# Patient Record
Sex: Female | Born: 1945 | Race: White | Hispanic: No | Marital: Married | Smoking: Never smoker
Health system: Southern US, Community
[De-identification: ages and names within clinical notes are randomized; demographics above are authoritative.]

## PROBLEM LIST (undated history)

## (undated) HISTORY — PX: APPENDECTOMY: SHX54

## (undated) HISTORY — PX: CERVICAL DISC SURGERY: SHX588

---

## 2010-11-15 ENCOUNTER — Emergency Department: Payer: Self-pay | Admitting: Emergency Medicine

## 2010-11-15 ENCOUNTER — Ambulatory Visit: Payer: Self-pay | Admitting: Family Medicine

## 2012-10-07 IMAGING — CR DG CHEST 1V PORT
1 series · 2 of 2 positions shown · non-contrast
Comparison: none

REASON FOR EXAM: sob
COMMENTS:

PROCEDURE:     DXR - DXR PORTABLE CHEST SINGLE VIEW  - November 15, 2010  [DATE]
RESULT:     Mild atelectasis versus infiltrate noted in the right lower
lobe. The left lung is clear. The cardiovascular structures are unremarkable.

[Series 1: view not recorded · 0.17mm/px · 2 of 2 slices shown]
[im 1/2]
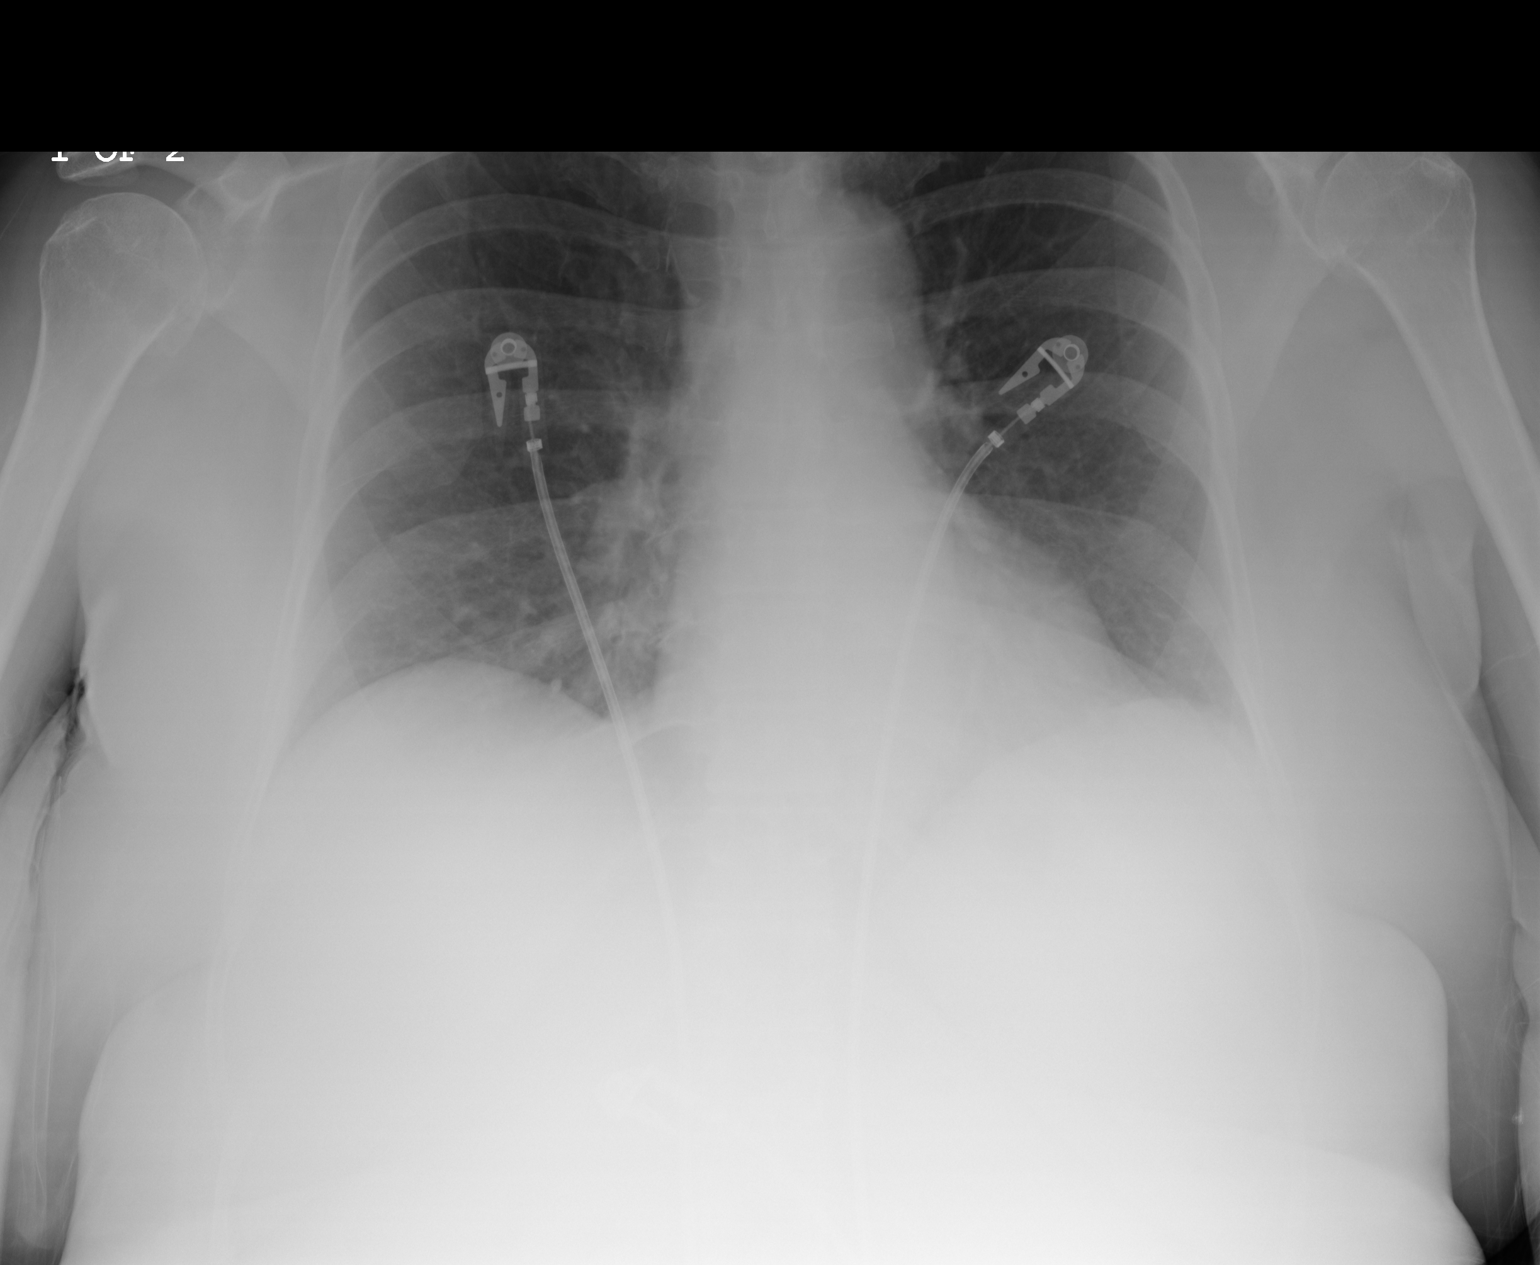
[im 2/2]
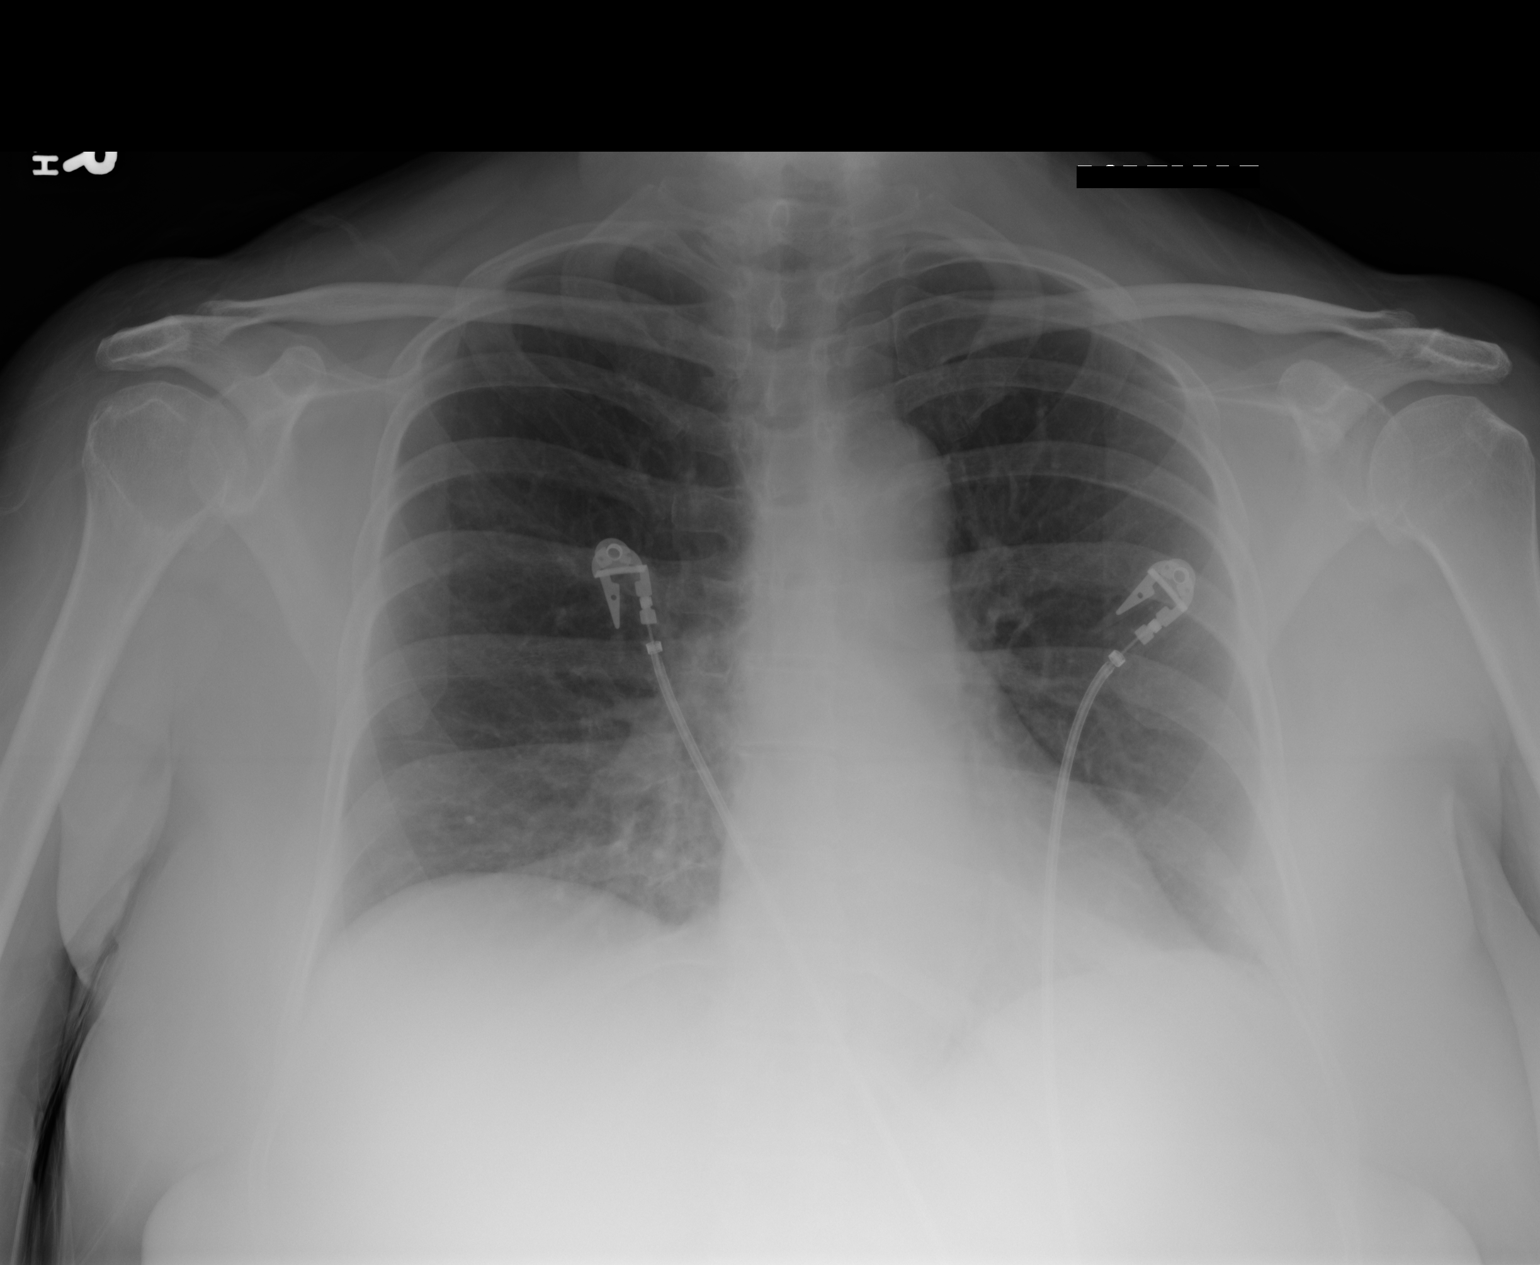

[2 of 2 positions shown; findings below may reference images not displayed]

IMPRESSION: Mild atelectasis versus infiltrate, right lower lobe.

## 2016-04-24 ENCOUNTER — Inpatient Hospital Stay: Payer: Medicare Other | Attending: Hematology and Oncology | Admitting: Hematology and Oncology

## 2016-04-24 ENCOUNTER — Encounter: Payer: Self-pay | Admitting: Hematology and Oncology

## 2016-04-24 ENCOUNTER — Inpatient Hospital Stay: Payer: Medicare Other

## 2016-04-24 DIAGNOSIS — Z79899 Other long term (current) drug therapy: Secondary | ICD-10-CM

## 2016-04-24 DIAGNOSIS — R5383 Other fatigue: Secondary | ICD-10-CM

## 2016-04-24 DIAGNOSIS — R238 Other skin changes: Secondary | ICD-10-CM | POA: Diagnosis not present

## 2016-04-24 DIAGNOSIS — D751 Secondary polycythemia: Secondary | ICD-10-CM | POA: Insufficient documentation

## 2016-04-24 DIAGNOSIS — R233 Spontaneous ecchymoses: Secondary | ICD-10-CM | POA: Insufficient documentation

## 2016-04-24 LAB — CBC WITH DIFFERENTIAL/PLATELET
Basophils Absolute: 0.1 10*3/uL (ref 0–0.1)
Basophils Relative: 1 %
Eosinophils Absolute: 0.1 10*3/uL (ref 0–0.7)
Eosinophils Relative: 1 %
HCT: 44.8 % (ref 35.0–47.0)
Hemoglobin: 15.2 g/dL (ref 12.0–16.0)
Lymphocytes Relative: 24 %
Lymphs Abs: 2.6 10*3/uL (ref 1.0–3.6)
MCH: 31.3 pg (ref 26.0–34.0)
MCHC: 33.9 g/dL (ref 32.0–36.0)
MCV: 92.2 fL (ref 80.0–100.0)
Monocytes Absolute: 0.7 10*3/uL (ref 0.2–0.9)
Monocytes Relative: 7 %
Neutro Abs: 7.3 10*3/uL — ABNORMAL HIGH (ref 1.4–6.5)
Neutrophils Relative %: 67 %
Platelets: 383 10*3/uL (ref 150–440)
RBC: 4.86 MIL/uL (ref 3.80–5.20)
RDW: 13.5 % (ref 11.5–14.5)
WBC: 10.8 10*3/uL (ref 3.6–11.0)

## 2016-04-24 LAB — PLATELET FUNCTION ASSAY: Collagen / Epinephrine: 124 seconds (ref 0–193)

## 2016-04-24 LAB — TSH: TSH: 1.652 u[IU]/mL (ref 0.350–4.500)

## 2016-04-24 LAB — T4, FREE: Free T4: 1.1 ng/dL (ref 0.61–1.12)

## 2016-04-24 NOTE — Progress Notes (Signed)
[facility]-  Cancer Center  Clinic day:  04/24/2016  Chief Complaint: Colleen Calhoun is a 70 y.o. female with easy bruising who is referred in consultation by Dr. Wendie Simmer for assessment and mangement.  HPI: The patient notes a several month history of easy bruisability. She states that she can barely be touched and be bruised. She has had prior surgery (appendectomy) without bleeding. She denies any other bleeding. She denies any aspirin or ibuprofen use. She denies any kidney disease. She previously had normal menses.  CBC on 04/09/2016 revealed a hematocrit of 46.4, hemoglobin 15.1, MCV 95, platelets 414,000, WBC 11,700.  PT 11.9 with an INR of 1.0.  PTT was 28.5.  Creatinine was 0.7 on 03/19/2016.  Symptomatically, she gets tired around 3 PM.  She denies any aspirin or ibuprofen use.  She denies any fevers or sweats.  She takes oral B12.  She is up-to-date on her mammogram (2017) and colonoscopy (2016).  She has dense breasts. She is unsure if she has sleep apnea.  She thinks that she snores. She does not smoke.   No past medical history on file.  Past Surgical History:  Procedure Laterality Date  . APPENDECTOMY    . CERVICAL DISC SURGERY      No family history on file.  Social History:  has no tobacco, alcohol, and drug history on file.  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  She lives in [place].  The patient is alone today.  Allergies:  Allergies  Allergen Reactions  . Codeine Nausea Only  . Morphine Rash    Current Medications: Current Outpatient Prescriptions  Medication Sig Dispense Refill  . Vitamin D, Ergocalciferol, (DRISDOL) 50000 units CAPS capsule Take one pill every 2 weeks    . ALPRAZolam (XANAX) 0.25 MG tablet TK 1 T PO NIGHTLY PRF SLP  5  . citalopram (CELEXA) 20 MG tablet TK 1 T PO ONCE D  3  . hydrochlorothiazide (HYDRODIURIL) 25 MG tablet   1  . pyridoxine (B-6) 100 MG tablet Take by mouth.     No current  facility-administered medications for this visit.     Review of Systems:  GENERAL:  Gets tired around 3 pm.  No fevers, sweats or weight loss. PERFORMANCE STATUS (ECOG):  1 HEENT:  No visual changes, runny nose, sore throat, mouth sores or tenderness. Lungs: No shortness of breath or cough.  No hemoptysis.  Snores (? sleep apnea). Cardiac:  No chest pain, palpitations, orthopnea, or PND. GI:  No nausea, vomiting, diarrhea, constipation, melena or hematochezia. Colonoscopy 2016. GU:  No urgency, frequency, dysuria, or hematuria. Musculoskeletal:  No back pain.  No joint pain.  No muscle tenderness. Extremities:  No pain or swelling. Skin:  Easy bruising.  No bleeding.  No rashes or skin changes. Neuro:  No headache, numbness or weakness, balance or coordination issues. Endocrine:  No diabetes, thyroid issues, hot flashes or night sweats. Psych:  No mood changes, depression or anxiety. Pain:  No focal pain. Review of systems:  All other systems reviewed and found to be negative.  Physical Exam: Blood pressure 124/76, pulse 65, temperature (!) 96.6 F (35.9 C), resp. rate 18, height 5\' 1"  (1.549 m), weight 143 lb 15.4 oz (65.3 kg). GENERAL:  Well developed, well nourished, woman sitting comfortably in the exam room in no acute distress. MENTAL STATUS:  Alert and oriented to person, place and time. HEAD:  Short styled blonde hair.  Normocephalic, atraumatic, face  symmetric, no Cushingoid features. EYES:  Pupils equal round and reactive to light and accomodation.  No conjunctivitis or scleral icterus. ENT:  Oropharynx clear without lesion.  Tongue normal. Mucous membranes moist.  RESPIRATORY:  Clear to auscultation without rales, wheezes or rhonchi. CARDIOVASCULAR:  Regular rate and rhythm without murmur, rub or gallop. ABDOMEN:  Soft, non-tender, with active bowel sounds, and no hepatosplenomegaly.  No masses. SKIN:  No rashes, ulcers or lesions. EXTREMITIES: No edema, no skin  discoloration or tenderness.  No palpable cords. LYMPH NODES: No palpable cervical, supraclavicular, axillary or inguinal adenopathy  NEUROLOGICAL: Unremarkable. PSYCH:  Appropriate.   No visits with results within 3 Day(s) from this visit.  Latest known visit with results is:  No results found for any previous visit.    Assessment:  Colleen Calhoun is a 70 y.o. female with a 6 month history of easy bruising.  She denies any bleeding.  She denies any history of heavy menses or bleeding with surgery.  She denies any new medications or herbal products.  She denies any aspirin or ibuprofen use.  She appears to have senile ecchymosis.  CBC on 04/09/2016 revealed a hematocrit of 46.4, hemoglobin 15.1, MCV 95, platelets 414,000, WBC 11,700.  PT 11.9 with an INR of 1.0.  PTT was 28.5.  Creatinine was 0.7 on 03/19/2016.  Symptomatically, she becomes fatigued at 3 PM.  Exam is unremarkable.  Plan: 1.  Discuss easy bruisability.  Outside labs note a normal platelet count and coagulation studies.  Doubt von Willebrand's disease.  No use of aspirin or ibuprofen.  Discuss platelet aggregation studies to r/o a qualitative platelet defect.  Discuss liklely senile ecchymosis (fragile skin with capillary bleeds in SQ tissue). 2.  Discuss checking TSH and fre T4 secondary to patient's fatigue.   3.  Discuss generous hematocrit (erythrocytosis if HCT > 48 or Hgb > 16 in women).  Patient may have sleep apnea accounting for fatigue.  Patient does not smoke. 4.  Labs today:  CBC with diff, von Willebrand panel, platelet function assay, TSH, free T4, epo level, carbon monoxide level. 5.  RTC in 2 weeks for MD assessment and review of labs.   Rosey BathMelissa C Corcoran, MD  04/24/2016, 11:27 AM

## 2016-04-24 NOTE — Progress Notes (Signed)
Patient here as new evaluation regarding bruising easily.  Referred by Dr. Ether GriffinsFowler.

## 2016-04-25 LAB — VON WILLEBRAND PANEL
Coagulation Factor VIII: 118 % (ref 57–163)
Ristocetin Co-factor, Plasma: 76 % (ref 50–200)
Von Willebrand Antigen, Plasma: 88 % (ref 50–200)

## 2016-04-25 LAB — COAG STUDIES INTERP REPORT: PDF Image: 0

## 2016-04-25 LAB — CARBON MONOXIDE, BLOOD (PERFORMED AT REF LAB): Carbon Monoxide, Blood: 3.5 % (ref 0.0–3.6)

## 2016-04-25 LAB — ERYTHROPOIETIN: Erythropoietin: 7 m[IU]/mL (ref 2.6–18.5)

## 2016-05-04 ENCOUNTER — Encounter: Payer: Self-pay | Admitting: Hematology and Oncology

## 2016-05-08 ENCOUNTER — Inpatient Hospital Stay (HOSPITAL_BASED_OUTPATIENT_CLINIC_OR_DEPARTMENT_OTHER): Payer: Medicare Other | Admitting: Hematology and Oncology

## 2016-05-08 ENCOUNTER — Encounter: Payer: Self-pay | Admitting: Hematology and Oncology

## 2016-05-08 VITALS — BP 133/84 | HR 56 | Temp 95.1°F | Resp 56 | Wt 145.5 lb

## 2016-05-08 DIAGNOSIS — R5383 Other fatigue: Secondary | ICD-10-CM

## 2016-05-08 DIAGNOSIS — Z79899 Other long term (current) drug therapy: Secondary | ICD-10-CM | POA: Diagnosis not present

## 2016-05-08 DIAGNOSIS — R233 Spontaneous ecchymoses: Secondary | ICD-10-CM

## 2016-05-08 DIAGNOSIS — R238 Other skin changes: Secondary | ICD-10-CM | POA: Diagnosis not present

## 2016-05-08 NOTE — Progress Notes (Signed)
Patient states she fell last week running from a hornet.  She scraped up her forearms.  Otherwise, no complaints.

## 2016-05-08 NOTE — Progress Notes (Signed)
[facility]-  Cancer Center  Clinic day:  05/08/2016  Chief Complaint: Colleen Calhoun is a 70 y.o. female with easy bruising who is seen for review of work-up and discussion regarding direction of therapy.  HPI: The patient was last seen in the hematology clinic on 04/24/2016.  At that time, she was seen for initial consultation.  She noted a 6 month history of easy bruising.  She also noted fatigue.  Outside labs revealed a normal platelet count, PT, PTT, and creatinine.  Work-up revealed a hematocrit of 44.8, hemoglobin 15.2, MCV 92.2, platelets 383,000, white count 10,800 with a normal differential.  von Willebrand's panel was normal (factor VIII of 118%, ristocetin cofactor 76% and von Willebrand's antigen 88%). Platelet function assay was normal  TSH and free T4 were normal.  Erythropoietin level was normal.  Carbon monoxide level was normal.  Symptomatically, she denies any new complaints.  History reviewed. No pertinent past medical history.  Past Surgical History:  Procedure Laterality Date  . APPENDECTOMY    . CERVICAL DISC SURGERY      History reviewed. No pertinent family history.  Social History:  reports that she has never smoked. She does not have any smokeless tobacco history on file. Her alcohol and drug histories are not on file.  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  She lives in [place].  The patient is alone today.  Allergies:  Allergies  Allergen Reactions  . Codeine Nausea Only  . Morphine Rash    Current Medications: Current Outpatient Prescriptions  Medication Sig Dispense Refill  . ALPRAZolam (XANAX) 0.25 MG tablet TK 1 T PO NIGHTLY PRF SLP  5  . citalopram (CELEXA) 20 MG tablet TK 1 T PO ONCE D  3  . hydrochlorothiazide (HYDRODIURIL) 25 MG tablet   1  . pyridoxine (B-6) 100 MG tablet Take by mouth.    . Vitamin D, Ergocalciferol, (DRISDOL) 50000 units CAPS capsule Take one pill every 2 weeks     No current  facility-administered medications for this visit.     Review of Systems:  GENERAL:  Gets tired around 3 pm.  No fevers, sweats or weight loss. PERFORMANCE STATUS (ECOG):  1 HEENT:  No visual changes, runny nose, sore throat, mouth sores or tenderness. Lungs: No shortness of breath or cough.  No hemoptysis.  Snores (? sleep apnea). Cardiac:  No chest pain, palpitations, orthopnea, or PND. GI:  No nausea, vomiting, diarrhea, constipation, melena or hematochezia. Colonoscopy 2016. GU:  No urgency, frequency, dysuria, or hematuria. Musculoskeletal:  No back pain.  No joint pain.  No muscle tenderness. Extremities:  No pain or swelling. Skin:  Easy bruising.  No bleeding.  No rashes or skin changes. Neuro:  No headache, numbness or weakness, balance or coordination issues. Endocrine:  No diabetes, thyroid issues, hot flashes or night sweats. Psych:  No mood changes, depression or anxiety. Pain:  No focal pain. Review of systems:  All other systems reviewed and found to be negative.  Physical Exam: Blood pressure 133/84, pulse (!) 56, temperature (!) 95.1 F (35.1 C), temperature source Tympanic, resp. rate (!) 56, weight 145 lb 8.1 oz (66 kg). GENERAL:  Well developed, well nourished, woman sitting comfortably in the exam room in no acute distress. MENTAL STATUS:  Alert and oriented to person, place and time. HEAD:  Short styled blonde hair.  Normocephalic, atraumatic, face symmetric, no Cushingoid features. EYES:  No conjunctivitis or scleral icterus. NEUROLOGICAL: Unremarkable. PSYCH:  Appropriate.   No visits with results within 3 Day(s) from this visit.  Latest known visit with results is:  Office Visit on 04/24/2016  Component Date Value Ref Range Status  . WBC 04/24/2016 10.8  3.6 - 11.0 K/uL Final  . RBC 04/24/2016 4.86  3.80 - 5.20 MIL/uL Final  . Hemoglobin 04/24/2016 15.2  12.0 - 16.0 g/dL Final  . HCT 40/98/1191 44.8  35.0 - 47.0 % Final  . MCV 04/24/2016 92.2  80.0 -  100.0 fL Final  . MCH 04/24/2016 31.3  26.0 - 34.0 pg Final  . MCHC 04/24/2016 33.9  32.0 - 36.0 g/dL Final  . RDW 47/82/9562 13.5  11.5 - 14.5 % Final  . Platelets 04/24/2016 383  150 - 440 K/uL Final  . Neutrophils Relative % 04/24/2016 67  % Final  . Neutro Abs 04/24/2016 7.3* 1.4 - 6.5 K/uL Final  . Lymphocytes Relative 04/24/2016 24  % Final  . Lymphs Abs 04/24/2016 2.6  1.0 - 3.6 K/uL Final  . Monocytes Relative 04/24/2016 7  % Final  . Monocytes Absolute 04/24/2016 0.7  0.2 - 0.9 K/uL Final  . Eosinophils Relative 04/24/2016 1  % Final  . Eosinophils Absolute 04/24/2016 0.1  0 - 0.7 K/uL Final  . Basophils Relative 04/24/2016 1  % Final  . Basophils Absolute 04/24/2016 0.1  0 - 0.1 K/uL Final  . Coagulation Factor VIII 04/25/2016 118  57 - 163 % Final  . Ristocetin Co-factor, Plasma 04/25/2016 76  50 - 200 % Final   Comment: (NOTE) Performed At: [facility] [address] [place], [place] [id number] Mila Homer MD ON:[id number]   . Von Willebrand Antigen, Plasma 04/25/2016 88  50 - 200 % Final   Comment: (NOTE) This test was developed and its performance characteristics determined by [facility]. It has not been cleared or approved by the Food and Drug Administration.   Marland Kitchen PFA Interpretation 04/24/2016          Final   Comment: Platelet function is normal. If patient history/physical examination give strong indication of a bleeding disorder repeat testing for confirmation.        Results of the test should always be interpreted in conjunction with the patient's medical history, clinical presentation and medication history. Patients with Hematocrit values <35.0% or Platelet counts <150,000/uL may result in values above the Laboratory established reference range.   . Collagen / Epinephrine 04/24/2016 124  0 - 193 seconds Final  . TSH 04/24/2016 1.652  0.350 - 4.500 uIU/mL Final  . Free T4 04/24/2016 1.10  0.61 - 1.12 ng/dL Final   Comment: (NOTE) Biotin  ingestion may interfere with free T4 tests. If the results are inconsistent with the TSH level, previous test results, or the clinical presentation, then consider biotin interference. If needed, order repeat testing after stopping biotin.   . Erythropoietin 04/25/2016 7.0  2.6 - 18.5 mIU/mL Final   Comment: (NOTE) Siemens Immulite 2000 Immunochemiluminometric assay [facility]) Performed At: [facility] [address] [place], [place] [id number] Mila Homer MD DG:[id number]   . Carbon Monoxide, Blood 04/25/2016 3.5  0.0 - 3.6 % Final   Comment: (NOTE)                            Environmental Exposure:  Nonsmokers           <3.7                             Smokers              <9.9                            Occupational Exposure:                             BEI                   3.5                                Detection Limit =  0.2 Performed At: [facility] [address] [place], [place] [id number] Mila Homer MD WU:[id number]   . Interpretation 04/25/2016 Note   Final   Comment: (NOTE) ------------------------------- COAGULATION: VON WILLEBRAND FACTOR ASSESSMENT CURRENT RESULTS ASSESSMENT The VWF:Ag is normal. The VWF:RCo is normal. The FVIII is normal. VON WILLEBRAND FACTOR ASSESSMENT CURRENT RESULTS INTERPRETATION - These results are not consistent with a diagnosis of VWD according to the current NHLBI guideline. VON WILLEBRAND FACTOR ASSESSMENT - Results may be falsely elevated and possibly falsely normal as VWF and FVIII may increase in samples drawn from patients (particularly children) who are visibly stressed at the time of phlebotomy, as acute phase reactants, or in response to certain drug therapies such as desmopressin. Repeat testing may be necessary before excluding a diagnosis of VWD especially if the clinical suspicion is high for an underlying bleeding disorder. The setting for phlebotomy should be as  calm as possible and patients should be encouraged to sit quietly prior to the blood draw. VON WILLEBRAND FACTOR ASSESSMENT                           DEFINITIONS - VWD - von Willebrand disease; VWF - von Villebrand factor; VWF:Ag - VWF antigen; VWF:RCo - VWF ristocetin cofactor activity; FVIII - factor VIII activity. - MEDICAL DIRECTOR: For questions regarding panel interpretation, please contact Nonda Lou) Alfred Levins, M.D. or Lebron Conners, M.D. at [facility] at [phone]. ------------------------------- DISCLAIMER These assessments and interpretations are provided as a convenience in support of the physician-patient relationship and are not intended to replace the physician's clinical judgment. They are derived from national guidelines in addition to other evidence and expert opinion. The clinician should consider this information within the context of clinical opinion and the individual patient. SEE GUIDANCE FOR VON WILLEBRAND FACTOR ASSESSMENT: (1) The National Heart, Lung and Blood Institute. The Diagnosis, Evaluation and Management of von Willebrand Disease. Lafe Garin, MD: [employer] Publication 6516679918. 2007. Available at http://kemp.com/. (2) Annie Sable et al. Othella Boyer J Hematol. 2009; 84(6):366-370. (3) Laffan M et al. Haemophilia. 2004;10(3):199-217. (4) Pasi KJ et al. Haemophilia. 2004; 10(3):218-231.   Marland Kitchen PDF Image 04/25/2016 .   Final   Comment: (NOTE) Performed At: [facility] [address] [place], [place] [id number] Vicenta Dunning PhD MW:[id number]     Assessment:  Colleen Calhoun is a 70 y.o. female with a 6 month history of easy bruising.  She denies any bleeding.  She denies any history of heavy menses or bleeding with surgery.  She denies any new medications or herbal products.  She denies any aspirin or ibuprofen use.  She appears to have senile  ecchymosis.  Labs on 04/09/2016 revealed a normal platelet count, PT, and PTT.  Creatinine was 0.7 on 03/19/2016  Work-up on 04/24/2016 revealed a hematocrit of 44.8, hemoglobin 15.2, MCV 92.2, platelets 383,000, white count 10,800 with a normal differential.  von Willebrand's panel and platelet function assay were normal  TSH and free T4 were normal.  Erythropoietin level and carbon monoxide level were normal.  She is up-to-date on her mammogram (2017) and colonoscopy (2016).  She is unsure if she has sleep apnea.  Symptomatically, she becomes fatigued at 3 PM.  Exam is unremarkable.  Plan: 1.  Review work-up.  CBC and coagulation work-up negative.  Suspect senile ecchymosis.   2.  Discuss normal thyroid function tests.   3.  Discuss upper limit normal hematocrit with normal labs (carbon monoxide level and epo level). 4.  RTC prn.   Rosey Bath, MD  05/08/2016, 10:38 AM

## 2016-05-12 ENCOUNTER — Encounter: Payer: Self-pay | Admitting: Hematology and Oncology
